# Patient Record
Sex: Male | Born: 2000 | Race: White | Hispanic: No | Marital: Single | Smoking: Never smoker
Health system: Southern US, Community
[De-identification: ages and names within clinical notes are randomized; demographics above are authoritative.]

## PROBLEM LIST (undated history)

## (undated) HISTORY — PX: APPENDECTOMY: SHX54

---
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
## 2021-01-05 ENCOUNTER — Other Ambulatory Visit: Payer: Self-pay

## 2021-01-05 ENCOUNTER — Emergency Department (HOSPITAL_COMMUNITY)
Admission: EM | Admit: 2021-01-05 | Discharge: 2021-01-05 | Disposition: A | Discharge status: Home / Self Care | Payer: 59 | Attending: Emergency Medicine | Admitting: Emergency Medicine

## 2021-01-05 ENCOUNTER — Emergency Department (HOSPITAL_COMMUNITY): Payer: 59

## 2021-01-05 ENCOUNTER — Encounter (HOSPITAL_COMMUNITY): Payer: Self-pay | Admitting: Emergency Medicine

## 2021-01-05 DIAGNOSIS — R059 Cough, unspecified: Secondary | ICD-10-CM | POA: Diagnosis present

## 2021-01-05 DIAGNOSIS — J069 Acute upper respiratory infection, unspecified: Secondary | ICD-10-CM | POA: Diagnosis not present

## 2021-01-05 DIAGNOSIS — F17200 Nicotine dependence, unspecified, uncomplicated: Secondary | ICD-10-CM | POA: Insufficient documentation

## 2021-01-05 DIAGNOSIS — J3489 Other specified disorders of nose and nasal sinuses: Secondary | ICD-10-CM | POA: Diagnosis not present

## 2021-01-05 DIAGNOSIS — Z20822 Contact with and (suspected) exposure to covid-19: Secondary | ICD-10-CM | POA: Diagnosis not present

## 2021-01-05 LAB — RESP PANEL BY RT-PCR (FLU A&B, COVID) ARPGX2
Influenza A by PCR: NEGATIVE
Influenza B by PCR: NEGATIVE
SARS Coronavirus 2 by RT PCR: NEGATIVE

## 2021-01-05 MED ORDER — ALBUTEROL SULFATE HFA 108 (90 BASE) MCG/ACT IN AERS
2.0000 | INHALATION_SPRAY | Freq: Once | RESPIRATORY_TRACT | Status: AC
  Administered 2021-01-05: 2 via RESPIRATORY_TRACT
  Filled 2021-01-05: qty 6.7

## 2021-01-05 MED ORDER — BENZONATATE 100 MG PO CAPS: 100.0000 mg | ORAL_CAPSULE | Freq: Three times a day (TID) | ORAL | 0 refills | Status: AC

## 2021-01-05 NOTE — ED Notes (Signed)
Portable X-Ray at bedside.

## 2021-01-05 NOTE — ED Notes (Signed)
ED Provider at bedside. 

## 2021-01-05 NOTE — ED Provider Notes (Signed)
[facility] EMERGENCY DEPARTMENT Provider Note   CSN: [id number] Arrival date & time: 01/05/21  0431     History Chief Complaint  Patient presents with   Cough and Rib Pain    Mike Perkins is a 20 y.o. male.  The history is provided by the patient.  Cough Severity:  Moderate Onset quality:  Gradual Duration:  3 days Timing:  Intermittent Progression:  Worsening Smoker: yes   Relieved by:  Nothing Worsened by:  Nothing Associated symptoms: rhinorrhea   Associated symptoms: no chills, no fever and no shortness of breath   Patient reports he has had cough for about 3 days.  He reports while he was at work earlier in the night, he had a coughing spell and felt a pop in his left lower ribs.  He has had pain in that region since that time.  No fevers or vomiting.  No anterior chest pain.  No shortness of breath.  No hemoptysis.  Patient is a smoker. He reports frequent rhinorrhea/sneezing     PMH-none Patient Active Problem List   Diagnosis Date Noted   Foot fracture 05/03/2015    Past Surgical History:  Procedure Laterality Date   APPENDECTOMY     LAPAROSCOPIC APPENDECTOMY N/A 06/06/2013   Procedure: APPENDECTOMY LAPAROSCOPIC;  Surgeon: Dalia Heading, MD;  Location: [facility];  Service: General;  Laterality: N/A;       History reviewed. No pertinent family history.  Social History   Tobacco Use   Smoking status: Never   Smokeless tobacco: Never  Substance Use Topics   Alcohol use: No   Drug use: No    Home Medications Prior to Admission medications   Medication Sig Start Date End Date Taking? Authorizing Provider  benzonatate (TESSALON) 100 MG capsule Take 1 capsule (100 mg total) by mouth every 8 (eight) hours. 01/05/21  Yes Zadie Rhine, MD  amoxicillin (AMOXIL) 500 MG tablet Take 1 tablet (500 mg total) by mouth 3 (three) times daily. 05/01/16   Babs Sciara, MD  ibuprofen (ADVIL,MOTRIN) 600 MG tablet Take 1 tablet (600 mg total) by mouth every  6 (six) hours as needed. 06/05/16   Burgess Amor, PA-C    Allergies    Patient has no known allergies.  Review of Systems   Review of Systems  Constitutional:  Negative for chills and fever.  HENT:  Positive for rhinorrhea.   Respiratory:  Positive for cough. Negative for shortness of breath.   Cardiovascular:  Negative for leg swelling.  Gastrointestinal:  Positive for diarrhea. Negative for vomiting.  All other systems reviewed and are negative.  Physical Exam Updated Vital Signs BP (!) 156/96   Pulse 89   Temp 99.8 F (37.7 C) (Oral)   Resp 20   Ht 1.93 m (6\' 4" )   Wt (!) 158.8 kg   SpO2 94%   BMI 42.60 kg/m   Physical Exam CONSTITUTIONAL: Well developed/well nourished HEAD: Normocephalic/atraumatic EYES: EOMI/PERRL ENMT: Mucous membranes moist, nasal congestion, uvula midline without erythema or exudate NECK: supple no meningeal signs CV: S1/S2 noted, no murmurs/rubs/gallops noted LUNGS: Scattered wheeze, no acute distress ABDOMEN: soft, nontender Chest-tenderness noted to left chest wall, no crepitus or bruising NEURO: Pt is awake/alert/appropriate, moves all extremitiesx4.  No facial droop.   EXTREMITIES: pulses normal/equal, full ROM, no lower extremity edema SKIN: warm, color normal PSYCH: no abnormalities of mood noted, alert and oriented to situation  ED Results / Procedures / Treatments   Labs (all labs ordered are  listed, but only abnormal results are displayed) Labs Reviewed  RESP PANEL BY RT-PCR (FLU A&B, COVID) [id number]    EKG None  Radiology DG Chest Port 1 View  Result Date: 01/05/2021 CLINICAL DATA:  20 year old male with cough for 3 days. "Felt a pop" left rib area. EXAM: PORTABLE CHEST 1 VIEW COMPARISON:  Chest radiographs 06/08/2011. FINDINGS: Portable AP upright views at 0456 hours. Lung volumes and mediastinal contours remain normal. Allowing for portable technique the lungs are clear. No pneumothorax or pleural effusion. Visualized tracheal  air column is within normal limits. No osseous abnormality identified. Negative visible bowel gas. IMPRESSION: Negative portable chest. Electronically Signed   By: Odessa Fleming M.D.   On: 01/05/2021 05:50    Procedures Procedures   Medications Ordered in ED Medications  albuterol (VENTOLIN HFA) 108 (90 Base) MCG/ACT inhaler 2 puff (2 puffs Inhalation Given 01/05/21 0605)    ED Course  I have reviewed the triage vital signs and the nursing notes.  Pertinent labs & imaging results that were available during my care of the patient were reviewed by me and considered in my medical decision making (see chart for details).    MDM Rules/Calculators/A&P                           Imaging is negative. Likely viral URI.  COVID and flu testing negative.  I advised to quit smoking.  Albuterol given to patient, Jerilynn Som have been prescribed Final Clinical Impression(s) / ED Diagnoses Final diagnoses:  Viral URI with cough    Rx / DC Orders ED Discharge Orders          Ordered    benzonatate (TESSALON) 100 MG capsule  Every 8 hours        01/05/21 0600             Zadie Rhine, MD 01/05/21 [phone]

## 2021-01-05 NOTE — ED Triage Notes (Addendum)
Pt had coughing spell at work and felt a "pop" in his L rib area. Pt here with c/o pain to L rib area. States has had a cough x 3 days. Pt has taken 2 at home Covid tests which were negative.

## 2021-06-09 ENCOUNTER — Encounter: Payer: Self-pay | Admitting: Emergency Medicine

## 2021-06-09 ENCOUNTER — Ambulatory Visit (INDEPENDENT_AMBULATORY_CARE_PROVIDER_SITE_OTHER): Payer: 59

## 2021-06-09 ENCOUNTER — Ambulatory Visit
Admission: EM | Admit: 2021-06-09 | Discharge: 2021-06-09 | Disposition: A | Discharge status: Home / Self Care | Payer: 59 | Attending: Student | Admitting: Student

## 2021-06-09 ENCOUNTER — Other Ambulatory Visit: Payer: Self-pay

## 2021-06-09 DIAGNOSIS — M79672 Pain in left foot: Secondary | ICD-10-CM

## 2021-06-09 DIAGNOSIS — S93492A Sprain of other ligament of left ankle, initial encounter: Secondary | ICD-10-CM

## 2021-06-09 NOTE — Discharge Instructions (Addendum)
-  Rest, ice, tylenol/ibuprofen ?-Brace while pain persists ?-Follow-up with [place] or orthopedist if pain persists in 1-2 weeks. ?

## 2021-06-09 NOTE — ED Provider Notes (Signed)
?[facility] ? ? ? ?CSN: [id number] ?Arrival date & time: 06/09/21  1324 ? ? ?  ? ?History   ?Chief Complaint ?Chief Complaint  ?Patient presents with  ? Foot Pain  ? ? ?HPI ?Mike Perkins is a 21 y.o. male presenting with L ankle pain following inversion injury. States he stepped off a curb and immediately heard a pop and felt pain. Didn't immediately seek medical attention as it was his son's birthday. Denies sensation changes. Denise history of injury to the foot or ankle in the past. Denies pain or injury elsewhere. Has been using a walker to get around.  ? ?HPI ? ?History reviewed. No pertinent past medical history. ? ?Patient Active Problem List  ? Diagnosis Date Noted  ? Foot fracture 05/03/2015  ? ? ?Past Surgical History:  ?Procedure Laterality Date  ? APPENDECTOMY    ? LAPAROSCOPIC APPENDECTOMY N/A 06/06/2013  ? Procedure: APPENDECTOMY LAPAROSCOPIC;  Surgeon: Dalia HeadingMark A Jenkins, MD;  Location: [facility];  Service: General;  Laterality: N/A;  ? ? ? ? ? ?Home Medications   ? ?Prior to Admission medications   ?Medication Sig Start Date End Date Taking? Authorizing Provider  ?amoxicillin (AMOXIL) 500 MG tablet Take 1 tablet (500 mg total) by mouth 3 (three) times daily. 05/01/16   Babs SciaraLuking, Scott A, MD  ?benzonatate (TESSALON) 100 MG capsule Take 1 capsule (100 mg total) by mouth every 8 (eight) hours. 01/05/21   Zadie RhineWickline, Donald, MD  ?ibuprofen (ADVIL,MOTRIN) 600 MG tablet Take 1 tablet (600 mg total) by mouth every 6 (six) hours as needed. 06/05/16   Burgess AmorIdol, Julie, PA-C  ? ? ?Family History ?History reviewed. No pertinent family history. ? ?Social History ?Social History  ? ?Tobacco Use  ? Smoking status: Never  ? Smokeless tobacco: Never  ?Substance Use Topics  ? Alcohol use: No  ? Drug use: No  ? ? ? ?Allergies   ?Patient has no known allergies. ? ? ?Review of Systems ?Review of Systems  ?Musculoskeletal:   ?     L foot/ankle pain   ?All other systems reviewed and are negative. ? ? ?Physical Exam ?Triage  Vital Signs ?ED Triage Vitals  ?Enc Vitals Group  ?   BP 06/09/21 1522 (!) 158/93  ?   Pulse Rate 06/09/21 1522 94  ?   Resp 06/09/21 1522 18  ?   Temp 06/09/21 1522 98.3 ?F (36.8 ?C)  ?   Temp Source 06/09/21 1522 Oral  ?   SpO2 06/09/21 1522 95 %  ?   Weight 06/09/21 1533 (!) 350 lb (158.8 kg)  ?   Height 06/09/21 1533 6\' 5"  (1.956 m)  ?   Head Circumference --   ?   Peak Flow --   ?   Pain Score 06/09/21 1533 2  ?   Pain Loc --   ?   Pain Edu? --   ?   Excl. in GC? --   ? ?No data found. ? ?Updated Vital Signs ?BP (!) 158/93 (BP Location: Right Arm)   Pulse 94   Temp 98.3 ?F (36.8 ?C) (Oral)   Resp 18   Ht 6\' 5"  (1.956 m)   Wt (!) 350 lb (158.8 kg)   SpO2 95%   BMI 41.50 kg/m?  ? ?Visual Acuity ?Right Eye Distance:   ?Left Eye Distance:   ?Bilateral Distance:   ? ?Right Eye Near:   ?Left Eye Near:    ?Bilateral Near:    ? ?Physical Exam ?Vitals  reviewed.  ?Constitutional:   ?   General: He is not in acute distress. ?   Appearance: Normal appearance. He is not ill-appearing.  ?HENT:  ?   Head: Normocephalic and atraumatic.  ?Pulmonary:  ?   Effort: Pulmonary effort is normal.  ?Musculoskeletal:  ?   Comments: TTP and 1+ swelling lateral malleolus. No midfoot or medial malleolar tenderness. Sensation intact. DP 2+, cap refill <2 seconds. Ambulating with pain. No proximal fibial/tibular pain.  ?Neurological:  ?   General: No focal deficit present.  ?   Mental Status: He is alert and oriented to person, place, and time.  ?Psychiatric:     ?   Mood and Affect: Mood normal.     ?   Behavior: Behavior normal.     ?   Thought Content: Thought content normal.     ?   Judgment: Judgment normal.  ? ? ? ?UC Treatments / Results  ?Labs ?(all labs ordered are listed, but only abnormal results are displayed) ?Labs Reviewed - No data to display ? ?EKG ? ? ?Radiology ?DG Ankle Complete Left ? 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 Appearance is similar to the previous studies from 2017. No evidence of an acute fracture. No dislocation. Ankle mortise is congruent. Mild soft tissue swelling at the hindfoot. IMPRESSION: 1. 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 Appearance is similar to remote prior studies. 2. Mild soft tissue swelling at the hindfoot. Electronically Signed   By: Duanne Guess D.O.   On: 06/09/2021 15:39  ? ?DG Foot Complete Left ? 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 Appearance is similar to the previous studies from 2017. No evidence of an acute fracture. No dislocation. Ankle mortise is congruent. Mild soft tissue swelling at the hindfoot. IMPRESSION: 1. 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 Appearance is similar to remote prior studies. 2. Mild soft tissue swelling at the hindfoot. Electronically Signed   By: Duanne Guess D.O.   On: 06/09/2021 15:39   ? ?Procedures ?Procedures (including critical care time) ? ?Medications Ordered in UC ?Medications - No data to display ? ?Initial Impression / Assessment and Plan / UC Course  ?I have reviewed the triage vital signs and the nursing notes. ? ?Pertinent labs & imaging results that were available during my care of the patient were reviewed by me and considered in my medical decision making (see chart for details). ? ?  ? ?This patient is a very pleasant 21 y.o. year old male presenting with L ankle sprain. Neurovascularly intact. ? ?1. Chronic  avascular necrosis involving the lateral aspect of the ?navicular with chronic fracture/fragmentation. Appearance is similar ?to remote prior studies. ?2. Mild soft tissue swelling at the hindfoot. ? ?Aso brace, walker while pain persists. F/u with ortho as needed. Work note provided.  ? ?ED return precautions discussed. Patient verbalizes understanding and agreement.  ? ? ?Final Clinical Impressions(s) / UC Diagnoses  ? ?Final diagnoses:  ?Sprain of anterior talofibular ligament of left ankle, initial encounter  ? ? ? ?  Discharge Instructions   ? ?  ?-Rest, ice, tylenol/ibuprofen ?-Brace while pain persists ?-Follow-up with [place] or orthopedist if pain persists in 1-2 weeks. ? ? ? ? ?ED Prescriptions   ?None ?  ? ?PDMP not reviewed this encounter. ?  ?Rhys Martini, PA-C ?06/09/21 1611 ? ?

## 2021-06-09 NOTE — ED Triage Notes (Signed)
Pt reports was stepping off a concrete slab and onto grass and reports felt "pop" in left calf. Pt noted to have moderate swelling to left ankle and bruising.  ? ?Pt using walker at time of arrival to urgent care. ?

## 2024-03-03 ENCOUNTER — Emergency Department (HOSPITAL_COMMUNITY)
Admission: EM | Admit: 2024-03-03 | Discharge: 2024-03-03 | Disposition: A | Discharge status: Home / Self Care | Payer: Self-pay | Source: Home / Self Care | Attending: Emergency Medicine | Admitting: Emergency Medicine

## 2024-03-03 ENCOUNTER — Emergency Department (HOSPITAL_COMMUNITY): Payer: Self-pay

## 2024-03-03 ENCOUNTER — Other Ambulatory Visit: Payer: Self-pay

## 2024-03-03 DIAGNOSIS — D72829 Elevated white blood cell count, unspecified: Secondary | ICD-10-CM | POA: Insufficient documentation

## 2024-03-03 DIAGNOSIS — J029 Acute pharyngitis, unspecified: Secondary | ICD-10-CM | POA: Insufficient documentation

## 2024-03-03 LAB — CBC WITH DIFFERENTIAL/PLATELET
Abs Immature Granulocytes: 0.1 K/uL — ABNORMAL HIGH (ref 0.00–0.07)
Basophils Absolute: 0.1 K/uL (ref 0.0–0.1)
Basophils Relative: 1 %
Eosinophils Absolute: 0.5 K/uL (ref 0.0–0.5)
Eosinophils Relative: 2 %
HCT: 42.8 % (ref 39.0–52.0)
Hemoglobin: 14.9 g/dL (ref 13.0–17.0)
Immature Granulocytes: 1 %
Lymphocytes Relative: 15 %
Lymphs Abs: 3.3 K/uL (ref 0.7–4.0)
MCH: 30.2 pg (ref 26.0–34.0)
MCHC: 34.8 g/dL (ref 30.0–36.0)
MCV: 86.6 fL (ref 80.0–100.0)
Monocytes Absolute: 1.4 K/uL — ABNORMAL HIGH (ref 0.1–1.0)
Monocytes Relative: 7 %
Neutro Abs: 15.9 K/uL — ABNORMAL HIGH (ref 1.7–7.7)
Neutrophils Relative %: 74 %
Platelets: 361 K/uL (ref 150–400)
RBC: 4.94 MIL/uL (ref 4.22–5.81)
RDW: 12.2 % (ref 11.5–15.5)
WBC: 21.3 K/uL — ABNORMAL HIGH (ref 4.0–10.5)
nRBC: 0 % (ref 0.0–0.2)

## 2024-03-03 LAB — COMPREHENSIVE METABOLIC PANEL WITH GFR
ALT: 59 U/L — ABNORMAL HIGH (ref 0–44)
AST: 37 U/L (ref 15–41)
Albumin: 4.4 g/dL (ref 3.5–5.0)
Alkaline Phosphatase: 78 U/L (ref 38–126)
Anion gap: 16 — ABNORMAL HIGH (ref 5–15)
BUN: 11 mg/dL (ref 6–20)
CO2: 20 mmol/L — ABNORMAL LOW (ref 22–32)
Calcium: 9.1 mg/dL (ref 8.9–10.3)
Chloride: 104 mmol/L (ref 98–111)
Creatinine, Ser: 0.86 mg/dL (ref 0.61–1.24)
GFR, Estimated: >60 mL/min (ref >60)
Glucose, Bld: 94 mg/dL (ref 70–99)
Potassium: 3.9 mmol/L (ref 3.5–5.1)
Sodium: 141 mmol/L (ref 135–145)
Total Bilirubin: 0.7 mg/dL (ref 0.0–1.2)
Total Protein: 8.3 g/dL — ABNORMAL HIGH (ref 6.5–8.1)

## 2024-03-03 LAB — MONONUCLEOSIS SCREEN: Mono Screen: NEGATIVE

## 2024-03-03 MED ORDER — SODIUM CHLORIDE 0.9 % IV BOLUS
1000.0000 mL | Freq: Once | INTRAVENOUS | Status: AC
  Administered 2024-03-03: 18:00:00 1000 mL via INTRAVENOUS

## 2024-03-03 MED ORDER — KETOROLAC TROMETHAMINE 15 MG/ML IJ SOLN
15.0000 mg | Freq: Once | INTRAMUSCULAR | Status: AC
  Administered 2024-03-03: 18:00:00 15 mg via INTRAVENOUS
  Filled 2024-03-03: qty 1

## 2024-03-03 MED ORDER — AMOXICILLIN-POT CLAVULANATE 875-125 MG PO TABS: 1.0000 | ORAL_TABLET | Freq: Two times a day (BID) | ORAL | 0 refills | Status: AC

## 2024-03-03 MED ORDER — AMOXICILLIN-POT CLAVULANATE 875-125 MG PO TABS
1.0000 | ORAL_TABLET | Freq: Once | ORAL | Status: AC
  Administered 2024-03-03: 19:00:00 1 via ORAL
  Filled 2024-03-03: qty 1

## 2024-03-03 MED ORDER — IOHEXOL 300 MG/ML  SOLN
80.0000 mL | Freq: Once | INTRAMUSCULAR | Status: AC | PRN
  Administered 2024-03-03: 19:00:00 80 mL via INTRAVENOUS

## 2024-03-03 MED ORDER — DEXAMETHASONE SOD PHOSPHATE PF 10 MG/ML IJ SOLN
10.0000 mg | Freq: Once | INTRAMUSCULAR | Status: AC
  Administered 2024-03-03: 18:00:00 10 mg via INTRAVENOUS

## 2024-03-03 MED ORDER — PREDNISONE 10 MG PO TABS: 40.0000 mg | ORAL_TABLET | Freq: Every day | ORAL | 0 refills | Status: AC

## 2024-03-03 NOTE — Discharge Instructions (Addendum)
 Please take all antibiotics as directed.  Follow-up closely with primary care doctor on an outpatient basis.  Return to emergency department immediately for any new or worsening symptoms to include difficulty swallowing, breathing, not tolerating your secretions.  [facility] Primary Care Doctor List    Rollene Pesa, MD. Specialty: [facility] Medicine Contact information: [address]  [place] [address]  [phone]   Glendia Fielding, MD. Specialty: [facility] Medicine Contact information: [address]  [place] [address]  [phone]   Benita Outhouse, MD Specialty: Internal Medicine Contact information: [address] [place] [address]  [phone]   Darlyn Hurst, MD. Specialty: Internal Medicine Contact information: [address]  [place] [address]  [phone]    [facility] (Dr. Luke) Specialty: Family Medicine Contact information: [address]  [place] [address]  [phone]   Garnette Lolling, MD. Specialty: [facility] Medicine Contact information: [address]  [address]  [place] [address]  [phone]   Gaither Langton, MD. Specialty: Internal Medicine Contact information: [address]  [address]  [place] [address]  [phone]   [facility] Family Medicine: [address] [phone]  [place], Family medicine [address]  [phone]  [facility] [address] [place], [place] [phone]  [facility]: [address] [phone]    [facility]  [address] [place], [place] [address] [phone]  Services [facility] offers a variety of basic health services.  Services include but are not limited to: Blood pressure checks  Heart rate checks  Blood sugar checks  Urine analysis  Rapid strep tests  Pregnancy tests.  Health education and referrals  People needing  more complex services will be directed to a physician online. Using these virtual visits, doctors can evaluate and prescribe medicine and treatments. There will be no medication on-site, though [facility] will help patients fill their prescriptions at little to no cost.   For More information please go to: [email or web address]  Allergy and Asthma:    [address] [place] [phone]  Urology:  [address]  [place] [phone]  [facility]  [address] [place], [place] [phone]  Orthopedics   [address] [place], [place] [phone]  Endocrinology  [address] [place], [place] [phone]  Podiatry: [facility] [phone]

## 2024-03-03 NOTE — ED Notes (Signed)
 Pt/family received d/c paperwork at this time. After going over the paperwork any questions, comments, or concerns were answered to the best of this nurse's knowledge. The pt/family verbally acknowledged the teachings/instructions.

## 2024-03-03 NOTE — ED Provider Notes (Signed)
  EMERGENCY DEPARTMENT AT [facility] Provider Note   CSN: [id number] Arrival date & time: 03/03/24  1524     Patient presents with: Sore Throat   Mike Perkins is a 23 y.o. male.   Patient is a 23 year old male who presents emergency department the chief complaint of ongoing sore throat.  Patient was evaluated in urgent care approximately 1 week ago and diagnosed with strep.  He was given a shot of penicillin at that point and no other medications.  He notes that he was initially feeling better but notes that his sore throat has since returned.  He notes that he has worsening pain with extension of his neck as well as with left rotation.  Patient denies any associated stridor, dysphagia, drooling, dyspnea.  He has had no recurrent fever or chills.  He denies any chest pain.   Sore Throat       Prior to Admission medications  Medication Sig Start Date End Date Taking? Authorizing Provider  amoxicillin  (AMOXIL ) 500 MG tablet Take 1 tablet (500 mg total) by mouth 3 (three) times daily. 05/01/16   Alphonsa Glendia DELENA, MD  benzonatate  (TESSALON ) 100 MG capsule Take 1 capsule (100 mg total) by mouth every 8 (eight) hours. 01/05/21   Midge Golas, MD  ibuprofen  (ADVIL ,MOTRIN ) 600 MG tablet Take 1 tablet (600 mg total) by mouth every 6 (six) hours as needed. 06/05/16   Idol, Julie, PA-C    Allergies: Patient has no known allergies.    Review of Systems  HENT:  Positive for sore throat.   All other systems reviewed and are negative.   Updated Vital Signs BP (!) 159/99 (BP Location: Right Arm)   Pulse (!) 118   Temp 98.1 F (36.7 C) (Oral)   Resp 18   Ht 6' 5 (1.956 m)   Wt (!) 167.4 kg   SpO2 94%   BMI 43.76 kg/m   Physical Exam Vitals and nursing note reviewed.  Constitutional:      General: He is not in acute distress.    Appearance: Normal appearance. He is not ill-appearing.  HENT:     Head: Normocephalic and atraumatic.     Nose: Nose  normal.     Mouth/Throat:     Mouth: Mucous membranes are moist.     Pharynx: Posterior oropharyngeal erythema present. No oropharyngeal exudate or uvula swelling.     Tonsils: No tonsillar abscesses. 2+ on the right. 2+ on the left.  Eyes:     Extraocular Movements: Extraocular movements intact.     Conjunctiva/sclera: Conjunctivae normal.     Pupils: Pupils are equal, round, and reactive to light.  Cardiovascular:     Rate and Rhythm: Normal rate and regular rhythm.     Pulses: Normal pulses.     Heart sounds: Normal heart sounds. No murmur heard.    No gallop.  Pulmonary:     Effort: Pulmonary effort is normal. No respiratory distress.     Breath sounds: Normal breath sounds. No stridor. No wheezing, rhonchi or rales.  Abdominal:     General: Abdomen is flat. Bowel sounds are normal.     Palpations: Abdomen is soft.     Tenderness: There is no abdominal tenderness. There is no guarding.  Musculoskeletal:        General: Normal range of motion.     Cervical back: Normal range of motion and neck supple.  Skin:    General: Skin is warm and dry.  Findings: No rash.  Neurological:     General: No focal deficit present.     Mental Status: He is alert and oriented to person, place, and time. Mental status is at baseline.  Psychiatric:        Mood and Affect: Mood normal.        Behavior: Behavior normal.        Thought Content: Thought content normal.        Judgment: Judgment normal.     (all labs ordered are listed, but only abnormal results are displayed) Labs Reviewed  COMPREHENSIVE METABOLIC PANEL WITH GFR  CBC WITH DIFFERENTIAL/PLATELET  MONONUCLEOSIS SCREEN    EKG: None  Radiology: No results found.   Procedures   Medications Ordered in the ED  dexamethasone  (DECADRON ) injection 10 mg (has no administration in time range)  ketorolac  (TORADOL ) 15 MG/ML injection 15 mg (has no administration in time range)  sodium chloride  0.9 % bolus 1,000 mL (has no  administration in time range)                                    Medical Decision Making Patient is doing much better at this time and is stable for discharge home.  Discussed with patient that CT scan demonstrated no signs of acute peritonsillar abscess, Ludwig's angina, retropharyngeal abscess, epiglottitis.  Patient notes that he does feel greatly improved after medications in the emergency department and has no signs of acute airway compromise to include stridor, dysphagia, drooling, dyspnea.  He is tolerating secretions without difficulty as well as p.o. intake.  Vital signs are stable at this point with no indication for sepsis.  Monotest was negative.  Will cover with Augmentin  and prednisone  at this time.  The need for close follow-up with primary care doctor was discussed as well as strict turn precautions for any new or worsening symptoms.  Patient voiced understanding and had no additional questions.  Amount and/or Complexity of Data Reviewed Labs: ordered. Radiology: ordered.  Risk Prescription drug management.        Final diagnoses:  None    ED Discharge Orders     None          Daralene Lonni BIRCH, PA-C 03/03/24 1911

## 2024-03-03 NOTE — ED Triage Notes (Signed)
 Pt with recurrent sore throat today, seen UC last week and positive for strep . Pt states sore throat returned today and seen UC again and strep resulted negative today, sent here for possible abscess. Pain to throat/neck with turning neck and swallowing
